# Patient Record
Sex: Male | Born: 1990 | Race: White | Hispanic: No | Marital: Single | Smoking: Current every day smoker
Health system: Southern US, Community
[De-identification: ages and names within clinical notes are randomized; demographics above are authoritative.]

## PROBLEM LIST (undated history)

## (undated) DIAGNOSIS — F1910 Other psychoactive substance abuse, uncomplicated: Secondary | ICD-10-CM

## (undated) HISTORY — PX: WISDOM TOOTH EXTRACTION: SHX21

## (undated) HISTORY — PX: ANTERIOR CRUCIATE LIGAMENT REPAIR: SHX115

---
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
## 2015-12-05 ENCOUNTER — Emergency Department (HOSPITAL_COMMUNITY): Payer: BLUE CROSS/BLUE SHIELD

## 2015-12-05 ENCOUNTER — Emergency Department (HOSPITAL_COMMUNITY)
Admission: EM | Admit: 2015-12-05 | Discharge: 2015-12-05 | Disposition: A | Discharge status: Home / Self Care | Payer: BLUE CROSS/BLUE SHIELD | Attending: Emergency Medicine | Admitting: Emergency Medicine

## 2015-12-05 ENCOUNTER — Encounter (HOSPITAL_COMMUNITY): Payer: Self-pay | Admitting: Vascular Surgery

## 2015-12-05 DIAGNOSIS — F17210 Nicotine dependence, cigarettes, uncomplicated: Secondary | ICD-10-CM | POA: Diagnosis not present

## 2015-12-05 DIAGNOSIS — W19XXXA Unspecified fall, initial encounter: Secondary | ICD-10-CM | POA: Insufficient documentation

## 2015-12-05 DIAGNOSIS — Y999 Unspecified external cause status: Secondary | ICD-10-CM | POA: Diagnosis not present

## 2015-12-05 DIAGNOSIS — Y929 Unspecified place or not applicable: Secondary | ICD-10-CM | POA: Diagnosis not present

## 2015-12-05 DIAGNOSIS — Y939 Activity, unspecified: Secondary | ICD-10-CM | POA: Diagnosis not present

## 2015-12-05 DIAGNOSIS — M25531 Pain in right wrist: Secondary | ICD-10-CM | POA: Insufficient documentation

## 2015-12-05 DIAGNOSIS — T401X1A Poisoning by heroin, accidental (unintentional), initial encounter: Secondary | ICD-10-CM | POA: Insufficient documentation

## 2015-12-05 HISTORY — DX: Other psychoactive substance abuse, uncomplicated: F19.10

## 2015-12-05 LAB — COMPREHENSIVE METABOLIC PANEL
ALK PHOS: 56 U/L (ref 38–126)
ALT: 72 U/L — ABNORMAL HIGH (ref 17–63)
ANION GAP: 8 (ref 5–15)
AST: 37 U/L (ref 15–41)
Albumin: 4.4 g/dL (ref 3.5–5.0)
BILIRUBIN TOTAL: 1.4 mg/dL — AB (ref 0.3–1.2)
BUN: 15 mg/dL (ref 6–20)
CALCIUM: 9.2 mg/dL (ref 8.9–10.3)
CO2: 24 mmol/L (ref 22–32)
Chloride: 106 mmol/L (ref 101–111)
Creatinine, Ser: 1.1 mg/dL (ref 0.61–1.24)
Glucose, Bld: 205 mg/dL — ABNORMAL HIGH (ref 65–99)
POTASSIUM: 3.7 mmol/L (ref 3.5–5.1)
Sodium: 138 mmol/L (ref 135–145)
TOTAL PROTEIN: 7.5 g/dL (ref 6.5–8.1)

## 2015-12-05 LAB — ACETAMINOPHEN LEVEL

## 2015-12-05 LAB — CBC
HEMATOCRIT: 43.6 % (ref 39.0–52.0)
Hemoglobin: 14.9 g/dL (ref 13.0–17.0)
MCH: 33.2 pg (ref 26.0–34.0)
MCHC: 34.2 g/dL (ref 30.0–36.0)
MCV: 97.1 fL (ref 78.0–100.0)
PLATELETS: 192 10*3/uL (ref 150–400)
RBC: 4.49 MIL/uL (ref 4.22–5.81)
RDW: 12.1 % (ref 11.5–15.5)
WBC: 12.3 10*3/uL — AB (ref 4.0–10.5)

## 2015-12-05 LAB — RAPID URINE DRUG SCREEN, HOSP PERFORMED
Amphetamines: NOT DETECTED
BARBITURATES: NOT DETECTED
Benzodiazepines: NOT DETECTED
COCAINE: NOT DETECTED
OPIATES: POSITIVE — AB
Tetrahydrocannabinol: POSITIVE — AB

## 2015-12-05 LAB — SALICYLATE LEVEL

## 2015-12-05 LAB — ETHANOL
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
## 2015-12-05 NOTE — Discharge Instructions (Signed)
Keep your wrist in the splint for 2 weeks, or until cleared by orthopedics.
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
## 2016-12-15 IMAGING — DX DG WRIST COMPLETE 3+V*R*
4 series · 4 of 4 positions shown · non-contrast
Comparison: None.

CLINICAL DATA: 25-year-old male with fall and right wrist pain.

EXAM:
RIGHT WRIST - COMPLETE 3+ VIEW

[x wrist pa right]
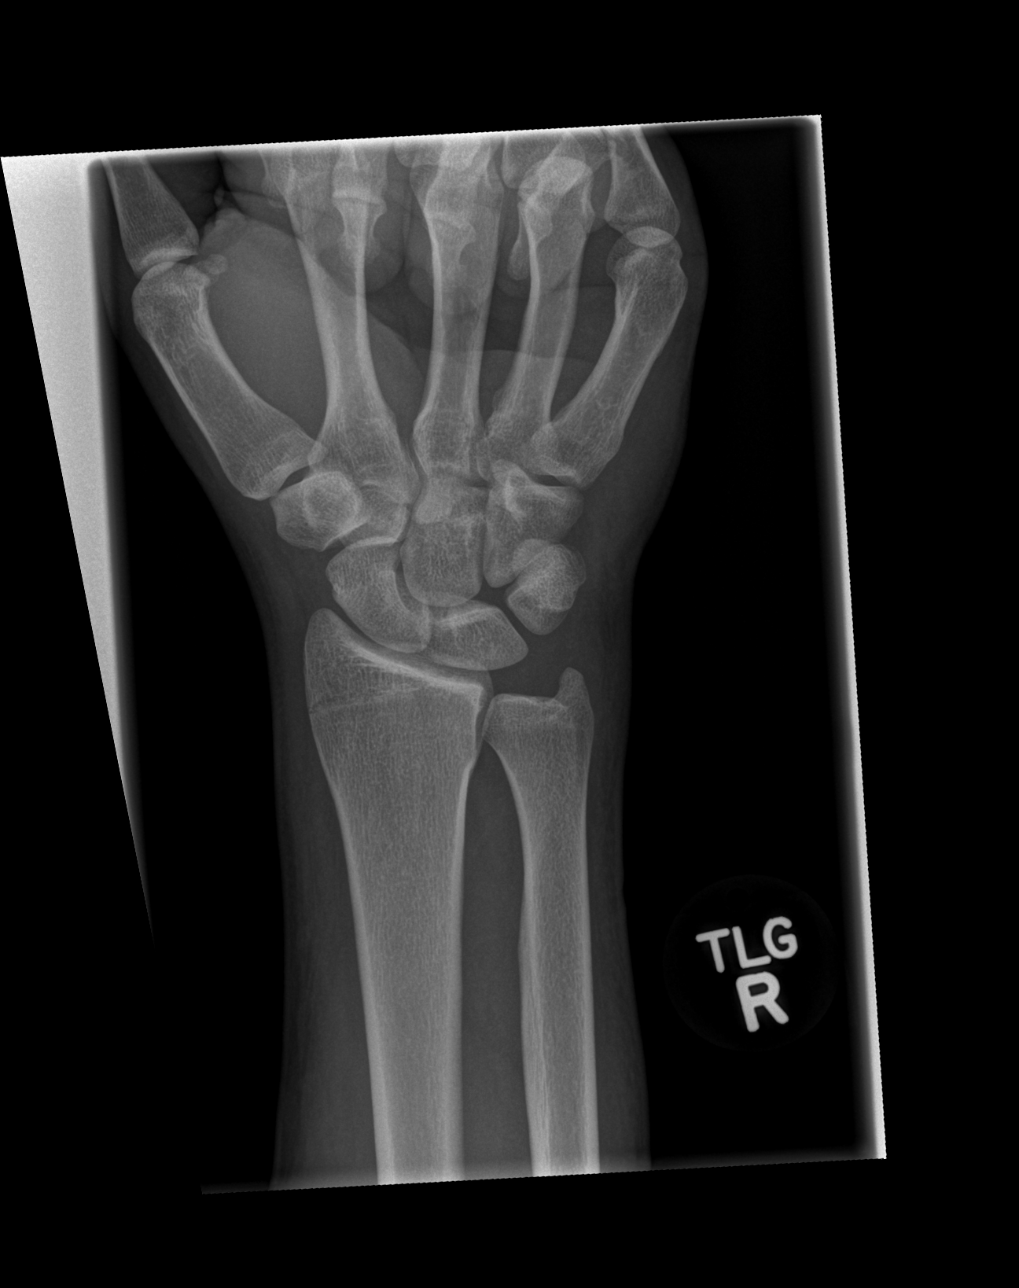

[x wrist obl right]
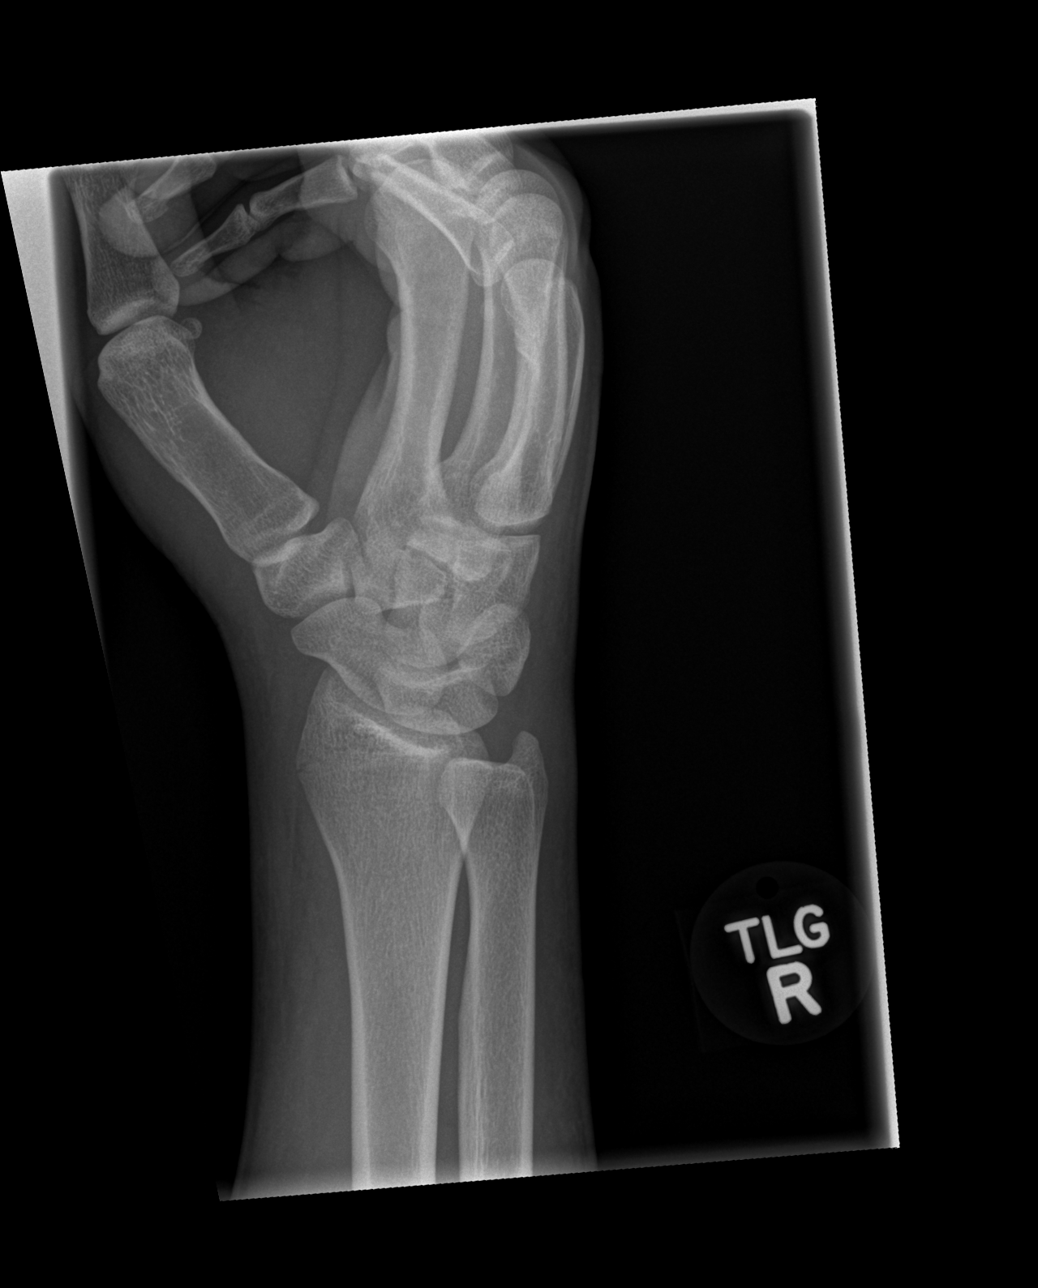

[x wrist lat right]
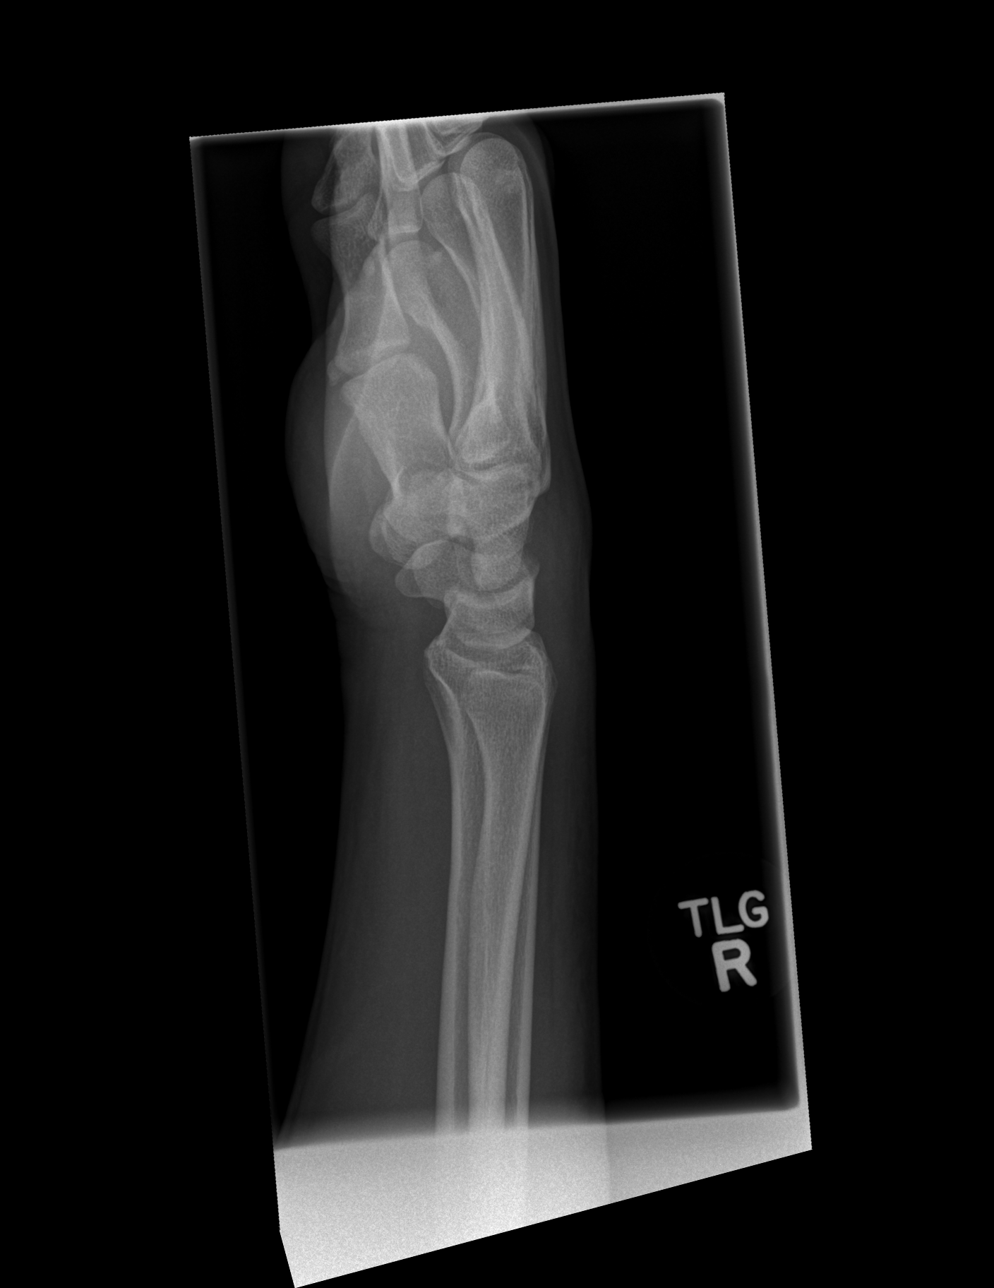

[x wrist navicular view right]
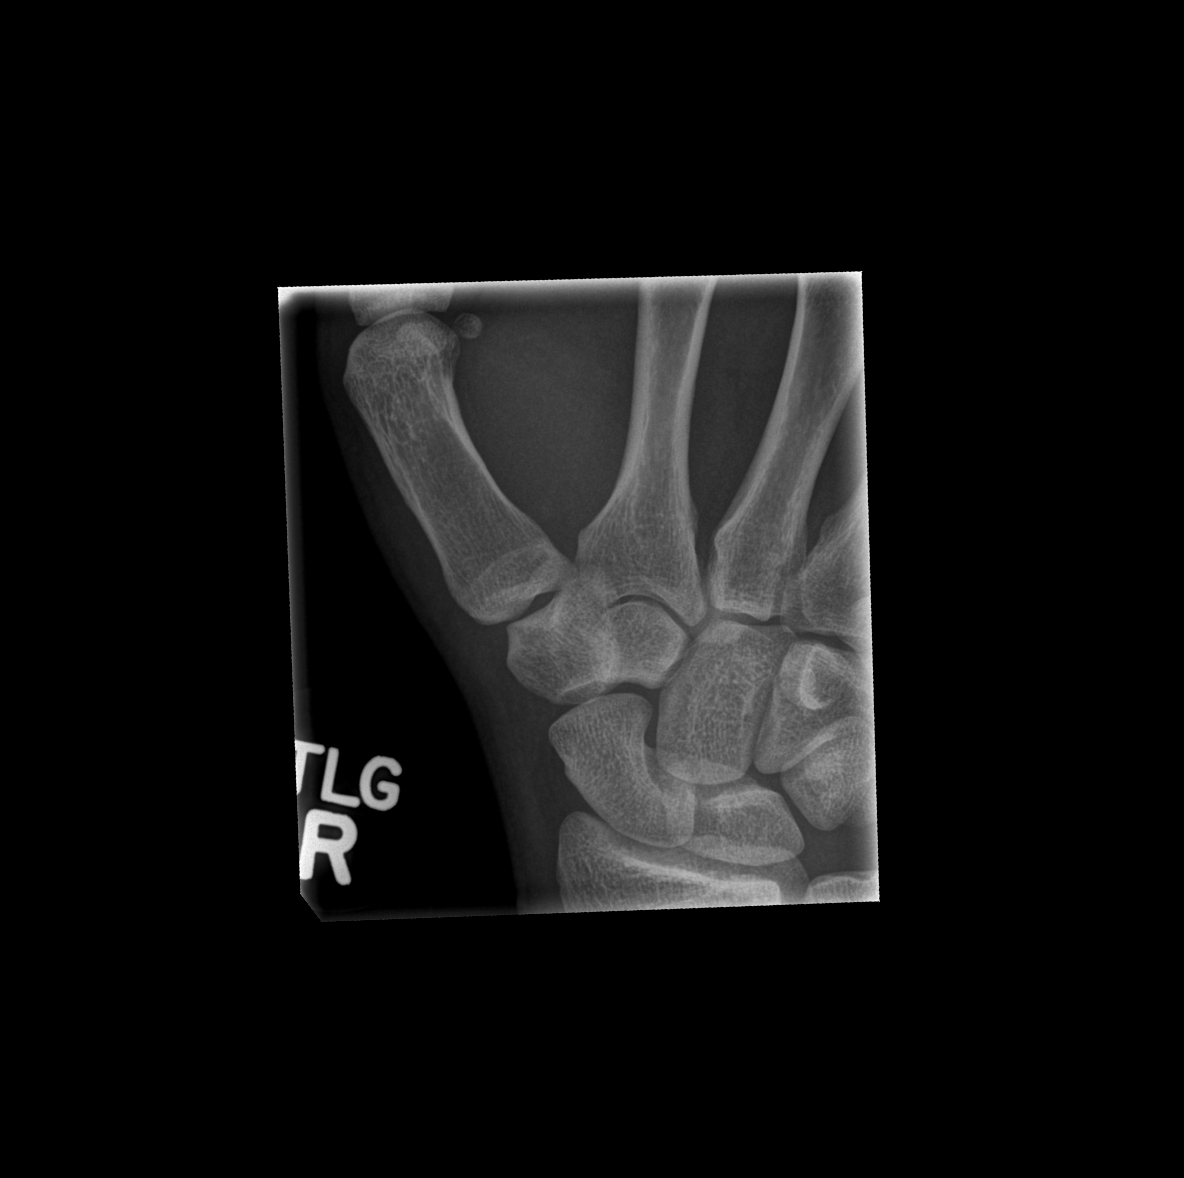

[4 of 4 positions shown; findings below may reference images not displayed]

FINDINGS: There is a faint nondisplaced linear lucency through the distal
radial metaphysis along the expected location of the growth plate.
This may represent a vascular groove or less likely non ossified
portion of the growth plate. Nondisplaced fracture is less likely
but not excluded. Correlation with clinical exam recommended. The
remainder of the osseous structures appear unremarkable. The bones
are well mineralized. No significant soft tissue swelling.
IMPRESSION: Faint lucency through the distal radial metaphysis may represent a
vascular groove versus less likely a nondisplaced fracture. Clinical
correlation is recommended. No other fracture identified.

## 2021-03-30 ENCOUNTER — Ambulatory Visit: Payer: Self-pay | Admitting: Behavioral Health
